# Patient Record
Sex: Male | Born: 1940 | Race: White | Hispanic: No | Marital: Single
Health system: Southern US, Community
[De-identification: ages and names within clinical notes are randomized; demographics above are authoritative.]

---

## 2009-02-07 ENCOUNTER — Ambulatory Visit: Payer: Self-pay | Admitting: Internal Medicine

## 2016-01-21 ENCOUNTER — Emergency Department: Payer: Medicare PPO

## 2016-01-21 ENCOUNTER — Emergency Department
Admission: EM | Admit: 2016-01-21 | Discharge: 2016-01-21 | Disposition: A | Discharge status: Home / Self Care | Payer: Medicare PPO | Attending: Emergency Medicine | Admitting: Emergency Medicine

## 2016-01-21 DIAGNOSIS — E119 Type 2 diabetes mellitus without complications: Secondary | ICD-10-CM | POA: Diagnosis not present

## 2016-01-21 DIAGNOSIS — R079 Chest pain, unspecified: Secondary | ICD-10-CM | POA: Diagnosis present

## 2016-01-21 DIAGNOSIS — I471 Supraventricular tachycardia: Secondary | ICD-10-CM | POA: Insufficient documentation

## 2016-01-21 LAB — TROPONIN I

## 2016-01-21 LAB — BASIC METABOLIC PANEL
ANION GAP: 8 (ref 5–15)
BUN: 31 mg/dL — ABNORMAL HIGH (ref 6–20)
CALCIUM: 9.4 mg/dL (ref 8.9–10.3)
CHLORIDE: 105 mmol/L (ref 101–111)
CO2: 23 mmol/L (ref 22–32)
Creatinine, Ser: 1.28 mg/dL — ABNORMAL HIGH (ref 0.61–1.24)
GFR calc Af Amer: >60 mL/min (ref >60)
GFR calc non Af Amer: 53 mL/min — ABNORMAL LOW (ref >60)
Glucose, Bld: 187 mg/dL — ABNORMAL HIGH (ref 65–99)
POTASSIUM: 4.7 mmol/L (ref 3.5–5.1)
Sodium: 136 mmol/L (ref 135–145)

## 2016-01-21 LAB — CBC
HEMATOCRIT: 41.1 % (ref 40.0–52.0)
HEMOGLOBIN: 14 g/dL (ref 13.0–18.0)
MCH: 30.1 pg (ref 26.0–34.0)
MCHC: 34.1 g/dL (ref 32.0–36.0)
MCV: 88.4 fL (ref 80.0–100.0)
Platelets: 248 10*3/uL (ref 150–440)
RBC: 4.64 MIL/uL (ref 4.40–5.90)
RDW: 15.4 % — ABNORMAL HIGH (ref 11.5–14.5)
WBC: 7.9 10*3/uL (ref 3.8–10.6)

## 2016-01-21 LAB — MAGNESIUM: MAGNESIUM: 1.8 mg/dL (ref 1.7–2.4)

## 2016-01-21 LAB — TSH: TSH: 2.598 u[IU]/mL (ref 0.350–4.500)

## 2016-01-21 MED ORDER — ADENOSINE 12 MG/4ML IV SOLN
INTRAVENOUS | Status: AC
  Filled 2016-01-21: qty 4

## 2016-01-21 MED ORDER — ADENOSINE 6 MG/2ML IV SOLN
INTRAVENOUS | Status: AC
  Administered 2016-01-21: 6 mg via INTRAVENOUS
  Filled 2016-01-21: qty 2

## 2016-01-21 MED ORDER — SODIUM CHLORIDE 0.9 % IV BOLUS (SEPSIS)
1000.0000 mL | Freq: Once | INTRAVENOUS | Status: AC
  Administered 2016-01-21: 1000 mL via INTRAVENOUS

## 2016-01-21 NOTE — ED Notes (Signed)
Patient presents to the ED c/o fast heart rate and chest pain. Patient states that he has had a similar episode that was caused by low magnesium in the past. Patient currently in no acute distress.

## 2016-01-21 NOTE — ED Notes (Signed)
Defib pads applied, Dr. Darnelle CatalanMalinda at bedside.

## 2016-01-21 NOTE — ED Notes (Signed)
Dr. Darnelle CatalanMalinda at bedside assessing patient

## 2016-01-21 NOTE — ED Notes (Signed)
Patient converted from SVT back to NSR. Patient currently in no acute distress. Patient on Non-rebreather sating at 99%. Patient has good color.

## 2016-01-21 NOTE — ED Provider Notes (Addendum)
[facility] Emergency Department Provider Note  ____________________________________________  Time seen: Approximately 4:19 PM  I have reviewed the triage vital signs and the nursing notes.   HISTORY  Chief Complaint Tachycardia and Chest Pain    HPI Michael Crosby is a 75 y.o. male who reports he got angry yesterday while in heavy traffic driving felt his heart start racing his heart is been racing of her sense that he had this one time before when his magnesium was low and since had a stent placed and has not had any trouble after that. He feels palpitations but has no chest pain shortness of breath or any other complaints. Patient does not smoke drink alcohol or caffeine.  No past medical history on file. Past medical history significant for diabetes There are no active problems to display for this patient.   No past surgical history on file. Past surgical history significant for a stent cardiac No current outpatient prescriptions on file.  Allergies Review of patient's allergies indicates not on file.  No family history on file.  Social History Social History  Substance Use Topics  . Smoking status: Not on file  . Smokeless tobacco: Not on file  . Alcohol Use: Not on file    Review of Systems Constitutional: No fever/chills Eyes: No visual changes. ENT: No sore throat. Cardiovascular: Denies chest pain. Respiratory: Denies shortness of breath. Gastrointestinal: No abdominal pain.  No nausea, no vomiting.  No diarrhea.  No constipation. Genitourinary: Negative for dysuria. Musculoskeletal: Negative for back pain. Skin: Negative for rash. Neurological: Negative for headaches, focal weakness or numbness.  10-point ROS otherwise negative.  ____________________________________________   PHYSICAL EXAM:  VITAL SIGNS: ED Triage Vitals  Enc Vitals Group     BP 01/21/16 1607 110/78 mmHg     Pulse Rate 01/21/16 1607 143     Resp 01/21/16  1607 16     Temp --      Temp src --      SpO2 01/21/16 1607 96 %     Weight --      Height --      Head Cir --      Peak Flow --      Pain Score 01/21/16 1608 0     Pain Loc --      Pain Edu? --      Excl. in GC? --     Constitutional: Alert and oriented. Well appearing and in no acute distress. Eyes: Conjunctivae are normal. PERRL. EOMI. Head: Atraumatic. Nose: No congestion/rhinnorhea. Mouth/Throat: Mucous membranes are moist.  Oropharynx non-erythematous. Neck: No stridor. Cardiovascular: Tachycardic rate, regular rhythm. Grossly normal heart sounds.  Good peripheral circulation. Respiratory: Normal respiratory effort.  No retractions. Lungs CTAB. Gastrointestinal: Soft and nontender. No distention. No abdominal bruits. No CVA tenderness. Musculoskeletal: No lower extremity tenderness trace edema.  No joint effusions. Neurologic:  Normal speech and language. No gross focal neurologic deficits are appreciated. No gait instability. Skin:  Skin is warm, dry and intact. No rash noted. Psychiatric: Mood and affect are normal. Speech and behavior are normal.  ____________________________________________   LABS (all labs ordered are listed, but only abnormal results are displayed)  Labs Reviewed  BASIC METABOLIC PANEL - Abnormal; Notable for the following:    Glucose, Bld 187 (*)    BUN 31 (*)    Creatinine, Ser 1.28 (*)    GFR calc non Af Amer 53 (*)    All other components within normal limits  CBC -  Abnormal; Notable for the following:    RDW 15.4 (*)    All other components within normal limits  TROPONIN I  MAGNESIUM  TSH   ____________________________________________  EKG  EKG Interpreted by me shows SVT at a rate of about 150 no acute ST-T wave changes  EKG interpreted by me after conversion shows sinus rhythm with first-degree AV block rate of 87 normal axis no acute ST-T changes essentially normal except for the first-degree AV block and a computer reading  possible left atrial enlargement ____________________________________________  RADIOLOGY  Chest x-ray read by radiology as no congestive failure ____________________________________________   PROCEDURES  Patient was prepared 6 mg of adenosine was placed patient converted to normal sinus rhythm with absolutely no symptoms   ____________________________________________   INITIAL IMPRESSION / ASSESSMENT AND PLAN / ED COURSE  Pertinent labs & imaging results that were available during my care of the patient were reviewed by me and considered in my medical decision making (see chart for details).   ____________________________________________   FINAL CLINICAL IMPRESSION(S) / ED DIAGNOSES  Final diagnoses:  SVT (supraventricular tachycardia) (HCC)      Arnaldo NatalPaul F Kierah Goatley, MD 01/22/16 [id number]  Arnaldo NatalPaul F Zaedyn Covin, MD 02/01/16 [phone]

## 2016-11-29 IMAGING — DX DG CHEST 1V PORT
1 series · 1 of 1 positions shown · non-contrast
Comparison: None.

CLINICAL DATA: Patient presents to the ED c/o onset fast heart rate
and chest pain. Patient states that he has had a similar episode
that was caused by low magnesium in the past. Patient currently in
no acute distress.

EXAM:
PORTABLE CHEST 1 VIEW

[chest ap]
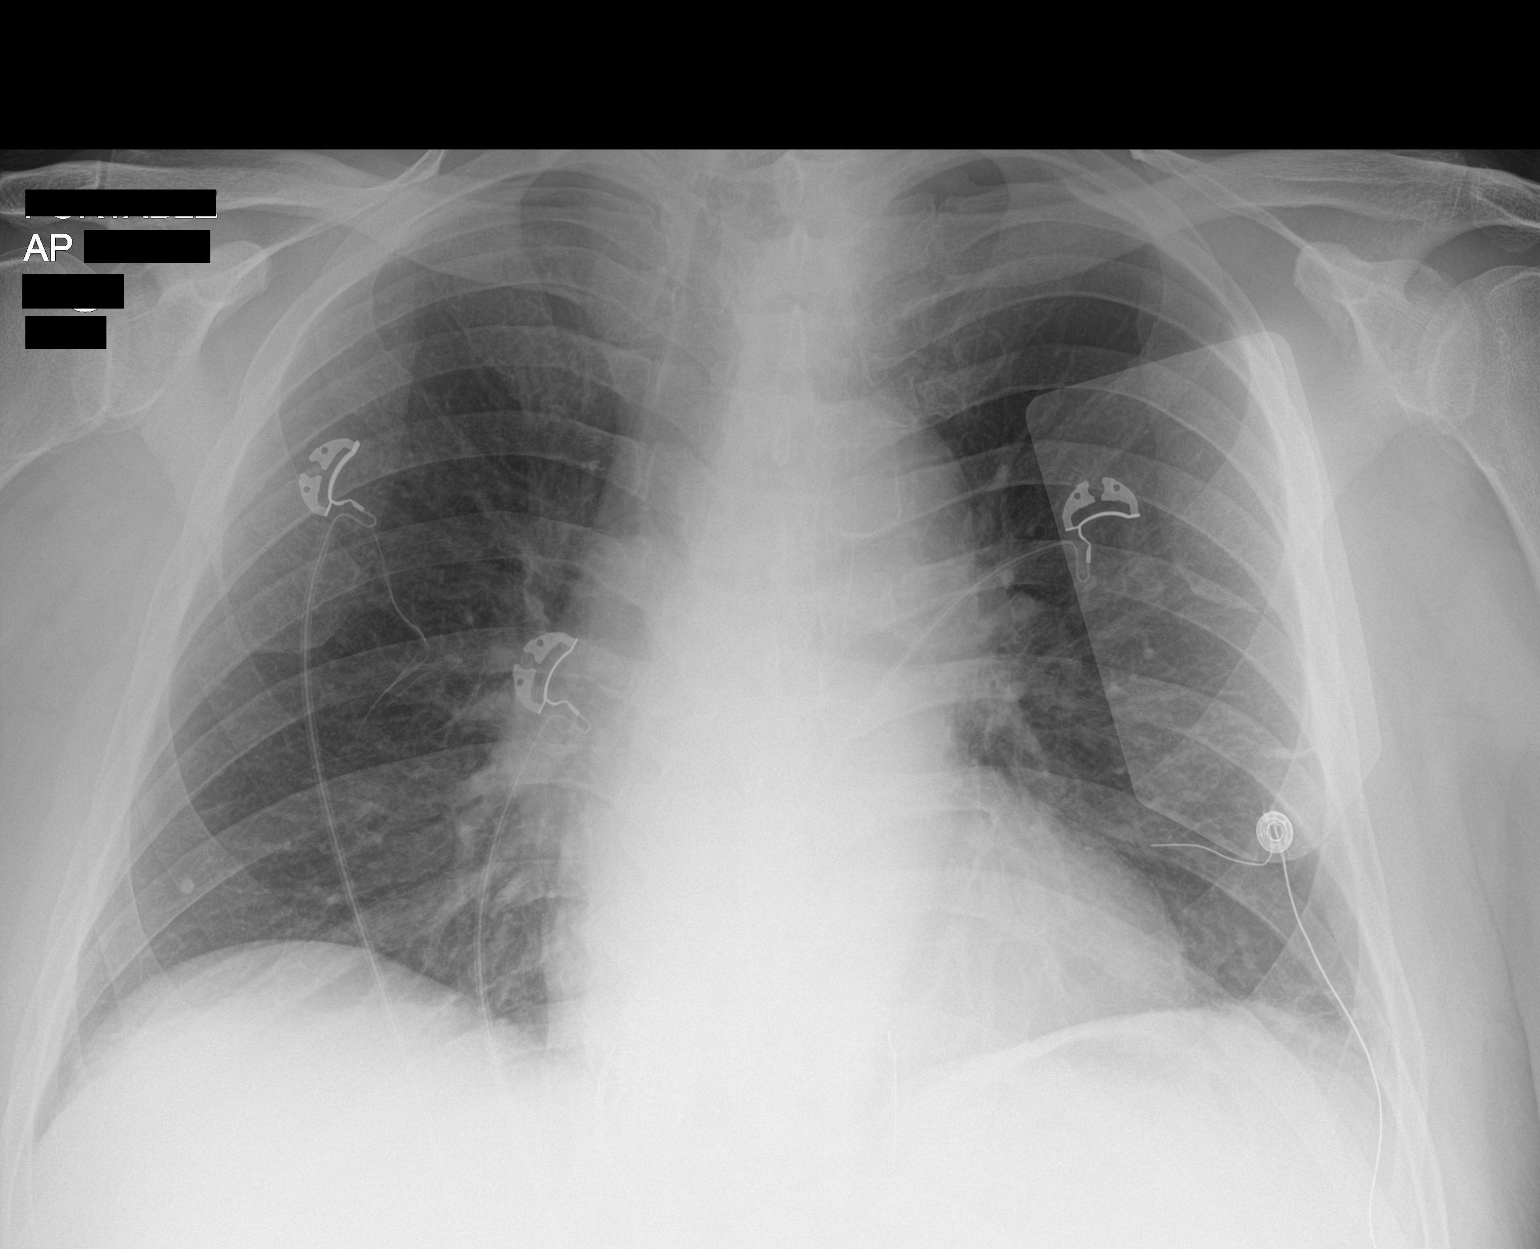

[1 of 1 positions shown; findings below may reference images not displayed]

FINDINGS: Numerous leads and wires project over the chest. Midline trachea.
Borderline cardiomegaly, accentuated by AP portable technique. No
pleural effusion or pneumothorax. Calcified right lower lobe
granuloma. No congestive failure. Suspect mild left base scarring or
atelectasis laterally. Atherosclerosis in the transverse aorta.
IMPRESSION: Borderline cardiomegaly, without acute disease.
